# Patient Record
Sex: Male | Born: 1990 | Race: White | Hispanic: No | Marital: Single | Smoking: Never smoker
Health system: Southern US, Community
[De-identification: ages and names within clinical notes are randomized; demographics above are authoritative.]

---
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
## 2013-08-26 ENCOUNTER — Emergency Department (HOSPITAL_COMMUNITY): Payer: No Typology Code available for payment source

## 2013-08-26 ENCOUNTER — Emergency Department (HOSPITAL_COMMUNITY)
Admission: EM | Admit: 2013-08-26 | Discharge: 2013-08-26 | Disposition: A | Discharge status: Home / Self Care | Payer: No Typology Code available for payment source | Attending: Emergency Medicine | Admitting: Emergency Medicine

## 2013-08-26 ENCOUNTER — Encounter (HOSPITAL_COMMUNITY): Payer: Self-pay | Admitting: Emergency Medicine

## 2013-08-26 DIAGNOSIS — S161XXA Strain of muscle, fascia and tendon at neck level, initial encounter: Secondary | ICD-10-CM

## 2013-08-26 DIAGNOSIS — S139XXA Sprain of joints and ligaments of unspecified parts of neck, initial encounter: Secondary | ICD-10-CM | POA: Insufficient documentation

## 2013-08-26 DIAGNOSIS — Y9389 Activity, other specified: Secondary | ICD-10-CM | POA: Insufficient documentation

## 2013-08-26 DIAGNOSIS — S39012A Strain of muscle, fascia and tendon of lower back, initial encounter: Secondary | ICD-10-CM

## 2013-08-26 DIAGNOSIS — S335XXA Sprain of ligaments of lumbar spine, initial encounter: Secondary | ICD-10-CM | POA: Insufficient documentation

## 2013-08-26 DIAGNOSIS — Y92410 Unspecified street and highway as the place of occurrence of the external cause: Secondary | ICD-10-CM | POA: Insufficient documentation

## 2013-08-26 MED ORDER — CYCLOBENZAPRINE HCL 5 MG PO TABS: 5.0000 mg | ORAL_TABLET | Freq: Three times a day (TID) | ORAL | Status: DC | PRN

## 2013-08-26 MED ORDER — IBUPROFEN 600 MG PO TABS: 600.0000 mg | ORAL_TABLET | Freq: Four times a day (QID) | ORAL | Status: DC | PRN
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
## 2013-08-27 NOTE — ED Provider Notes (Signed)
Medical screening examination/treatment/procedure(s) were performed by non-physician practitioner and as supervising physician I was immediately available for consultation/collaboration.    Tenee Wish R Zhaire Locker, MD 08/27/13 2259 
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
## 2015-01-04 ENCOUNTER — Ambulatory Visit (INDEPENDENT_AMBULATORY_CARE_PROVIDER_SITE_OTHER): Payer: BLUE CROSS/BLUE SHIELD | Admitting: Nurse Practitioner

## 2015-01-04 ENCOUNTER — Encounter: Payer: Self-pay | Admitting: Nurse Practitioner

## 2015-01-04 ENCOUNTER — Encounter (INDEPENDENT_AMBULATORY_CARE_PROVIDER_SITE_OTHER): Payer: Self-pay

## 2015-01-04 VITALS — BP 132/84 | HR 81 | Temp 97.0°F | Ht 70.0 in | Wt 181.0 lb

## 2015-01-04 DIAGNOSIS — J340 Abscess, furuncle and carbuncle of nose: Secondary | ICD-10-CM

## 2015-01-04 MED ORDER — SULFAMETHOXAZOLE-TRIMETHOPRIM 800-160 MG PO TABS: 1.0000 | ORAL_TABLET | Freq: Two times a day (BID) | ORAL | Status: DC

## 2015-01-04 NOTE — Progress Notes (Signed)
   Subjective:    Patient ID: Edwin Baxter, male    DOB: 03/28/1991, 24 y.o.   MRN: [id number]  HPI Patient in c/o sore on th tip of his nose- started yesterday as a burning sensation and is sore to the touch. No drainage.    Review of Systems  Constitutional: Negative.   HENT: Negative.   Respiratory: Negative.   Cardiovascular: Negative.   Gastrointestinal: Negative.   Genitourinary: Negative.   Psychiatric/Behavioral: Negative.   All other systems reviewed and are negative.      Objective:   Physical Exam  Constitutional: He is oriented to person, place, and time. He appears well-developed and well-nourished.  HENT:  Right Ear: Hearing, tympanic membrane, external ear and ear canal normal.  Left Ear: Hearing, tympanic membrane, external ear and ear canal normal.  Nose: No mucosal edema or rhinorrhea.  Mouth/Throat: Uvula is midline, oropharynx is clear and moist and mucous membranes are normal.  Tip of nose erythematous and tender to touch  Cardiovascular: Normal rate, regular rhythm and normal heart sounds.   Pulmonary/Chest: Effort normal and breath sounds normal.  Neurological: He is alert and oriented to person, place, and time.  Skin: Skin is warm.  Psychiatric: He has a normal mood and affect. His behavior is normal. Judgment and thought content normal.    BP 132/84 mmHg  Pulse 81  Temp(Src) 97 F (36.1 C) (Oral)  Ht 5\' 10"  (1.778 m)  Wt 181 lb (82.101 kg)  BMI 25.97 kg/m2        Assessment & Plan:  1. Cellulitis of nose, external Do not pick or scratch Warm compresses RTO prn  Meds ordered this encounter  Medications  . sulfamethoxazole-trimethoprim (BACTRIM DS) 800-160 MG per tablet    Sig: Take 1 tablet by mouth 2 (two) times daily.    Dispense:  14 tablet    Refill:  0    Order Specific Question:  Supervising Provider    Answer:  Ernestina PennaMOORE, DONALD W [[id number]]   Mary-Margaret Daphine DeutscherMartin, FNP

## 2015-01-04 NOTE — Patient Instructions (Signed)

## 2015-01-12 ENCOUNTER — Encounter: Payer: Self-pay | Admitting: Nurse Practitioner

## 2015-01-12 ENCOUNTER — Ambulatory Visit (INDEPENDENT_AMBULATORY_CARE_PROVIDER_SITE_OTHER): Payer: BLUE CROSS/BLUE SHIELD | Admitting: Nurse Practitioner

## 2015-01-12 VITALS — BP 131/84 | HR 80 | Temp 96.7°F | Ht 70.0 in | Wt 180.4 lb

## 2015-01-12 DIAGNOSIS — J340 Abscess, furuncle and carbuncle of nose: Secondary | ICD-10-CM

## 2015-01-12 MED ORDER — DOXYCYCLINE HYCLATE 100 MG PO TABS: 100.0000 mg | ORAL_TABLET | Freq: Two times a day (BID) | ORAL | Status: DC

## 2015-01-12 NOTE — Progress Notes (Signed)
   Subjective:    Patient ID: Edwin Baxter, male    DOB: 08/30/1991, 24 y.o.   MRN: [id number]  HPI Patient was seen April 14,2016 with an infection on tip of nose. Was given bactrim and was getting better- Started swelling and turning red again this morning. No drainage.    Review of Systems  Constitutional: Negative.   HENT: Negative.   Respiratory: Negative.   Cardiovascular: Negative.   Genitourinary: Negative.   Neurological: Negative.   Psychiatric/Behavioral: Negative.   All other systems reviewed and are negative.      Objective:   Physical Exam  Constitutional: He appears well-developed and well-nourished.  Cardiovascular: Normal rate, regular rhythm and normal heart sounds.   Pulmonary/Chest: Effort normal and breath sounds normal.  Skin: Skin is warm.  Tip of nose red and indurated- small pustule visible    BP 131/84 mmHg  Pulse 80  Temp(Src) 96.7 F (35.9 C) (Oral)  Ht 5\' 10"  (1.778 m)  Wt 180 lb 6.4 oz (81.829 kg)  BMI 25.88 kg/m2       Assessment & Plan:   1. Cellulitis of nose, external    Meds ordered this encounter  Medications  . doxycycline (VIBRA-TABS) 100 MG tablet    Sig: Take 1 tablet (100 mg total) by mouth 2 (two) times daily. 1 po bid    Dispense:  20 tablet    Refill:  0    Order Specific Question:  Supervising Provider    Answer:  Ernestina PennaMOORE, DONALD W [[id number]]   Warm compresses Do not pick or scratch RTO prn  Mary-Margaret Daphine DeutscherMartin, FNP

## 2015-05-04 ENCOUNTER — Telehealth: Payer: Self-pay | Admitting: Nurse Practitioner

## 2015-05-07 NOTE — Telephone Encounter (Signed)
Called [facility] [employer] to let them know we received release/lc

## 2015-05-08 ENCOUNTER — Encounter (INDEPENDENT_AMBULATORY_CARE_PROVIDER_SITE_OTHER): Payer: Self-pay

## 2015-05-08 ENCOUNTER — Ambulatory Visit (INDEPENDENT_AMBULATORY_CARE_PROVIDER_SITE_OTHER): Payer: BLUE CROSS/BLUE SHIELD

## 2015-05-08 ENCOUNTER — Encounter: Payer: Self-pay | Admitting: Family

## 2015-05-08 ENCOUNTER — Ambulatory Visit (INDEPENDENT_AMBULATORY_CARE_PROVIDER_SITE_OTHER): Payer: BLUE CROSS/BLUE SHIELD | Admitting: Family

## 2015-05-08 VITALS — BP 137/92 | HR 73 | Temp 97.7°F

## 2015-05-08 DIAGNOSIS — S93402A Sprain of unspecified ligament of left ankle, initial encounter: Secondary | ICD-10-CM

## 2015-05-08 DIAGNOSIS — M25572 Pain in left ankle and joints of left foot: Secondary | ICD-10-CM

## 2015-05-08 MED ORDER — IBUPROFEN 600 MG PO TABS: 600.0000 mg | ORAL_TABLET | Freq: Three times a day (TID) | ORAL | Status: DC | PRN

## 2015-05-08 NOTE — Patient Instructions (Signed)

## 2015-05-08 NOTE — Progress Notes (Signed)
   Subjective:    Patient ID: Edwin Baxter, male    DOB: 1991-03-04, 24 y.o.   MRN: [id number]  Ankle Pain  The incident occurred 3 to 5 days ago (Sunday). The incident occurred in the yard. The injury mechanism was a twisting injury. The pain is present in the left ankle. The quality of the pain is described as aching. The pain is at a severity of 5/10. The pain is moderate. The pain has been intermittent since onset. Pertinent negatives include no inability to bear weight, numbness or tingling. He reports no foreign bodies present. The symptoms are aggravated by movement and weight bearing. He has tried ice for the symptoms. The treatment provided mild relief.      Review of Systems  Constitutional: Negative.   HENT: Negative.   Respiratory: Negative.   Cardiovascular: Negative.   Gastrointestinal: Negative.   Endocrine: Negative.   Genitourinary: Negative.   Musculoskeletal: Negative.   Neurological: Negative.  Negative for tingling and numbness.  Hematological: Negative.   Psychiatric/Behavioral: Negative.   All other systems reviewed and are negative.      Objective:   Physical Exam  Constitutional: He is oriented to person, place, and time. He appears well-developed and well-nourished. No distress.  HENT:  Head: Normocephalic.  Eyes: Pupils are equal, round, and reactive to light. Right eye exhibits no discharge. Left eye exhibits no discharge.  Neck: Normal range of motion. Neck supple. No thyromegaly present.  Cardiovascular: Normal rate, regular rhythm, normal heart sounds and intact distal pulses.   No murmur heard. Pulmonary/Chest: Effort normal and breath sounds normal. No respiratory distress. He has no wheezes.  Abdominal: Soft. Bowel sounds are normal. He exhibits no distension. There is no tenderness.  Musculoskeletal: Normal range of motion. He exhibits edema and tenderness.  Left lateral ankle with mild swelling and ecchymosis present   Neurological: He is  alert and oriented to person, place, and time. He has normal reflexes. No cranial nerve deficit.  Skin: Skin is warm and dry. No rash noted. No erythema.  Psychiatric: He has a normal mood and affect. His behavior is normal. Judgment and thought content normal.  Vitals reviewed.  Left ankle x-ray- WNL Preliminary reading by Jannifer Rodney, FNP [facility]     BP 137/92 mmHg  Pulse 73  Temp(Src) 97.7 F (36.5 C) (Oral)     Assessment & Plan:  1. Pain in joint, ankle and foot, left - DG Ankle Complete Left; Future  2. Left ankle sprain, initial encounter -Rest -Keep elevated -Ice -Compression- ACE wrapped shown to pt and left ankle wrapped in office today -RTO as needed - ibuprofen (ADVIL,MOTRIN) 600 MG tablet; Take 1 tablet (600 mg total) by mouth every 8 (eight) hours as needed.  Dispense: 45 tablet; Refill: 0    Jannifer Rodney, FNP

## 2016-01-17 IMAGING — CR DG ANKLE COMPLETE 3+V*L*
3 series · 3 of 3 positions shown · non-contrast
Comparison: None.

CLINICAL DATA: Injury of the left ankle while playing basketball 2
days ago, persistent pain and swelling

EXAM:
LEFT ANKLE COMPLETE - 3+ VIEW

[view not recorded (1 of 3)]
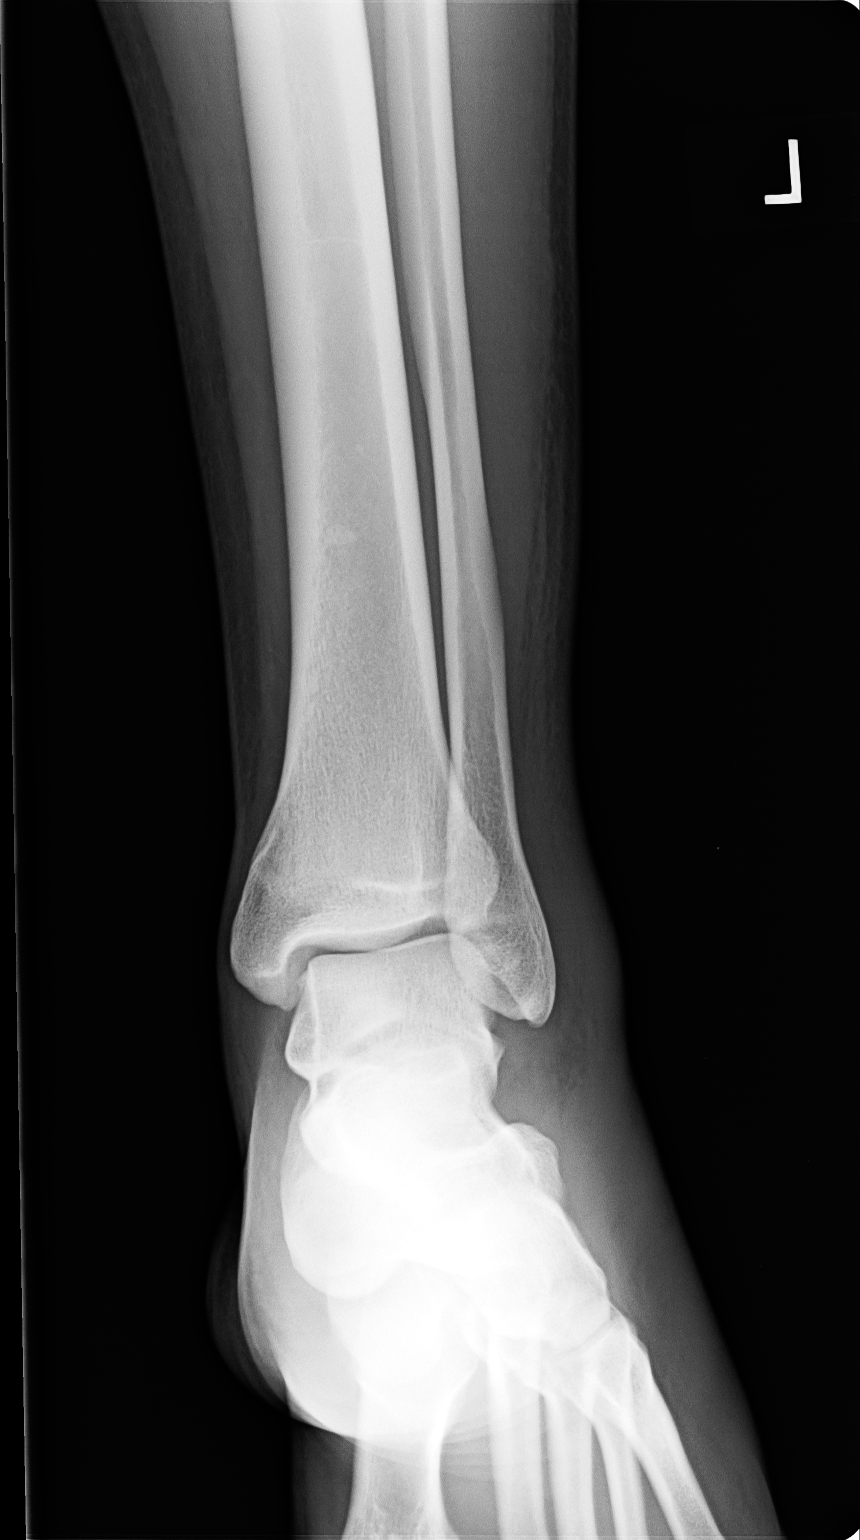

[view not recorded (2 of 3)]
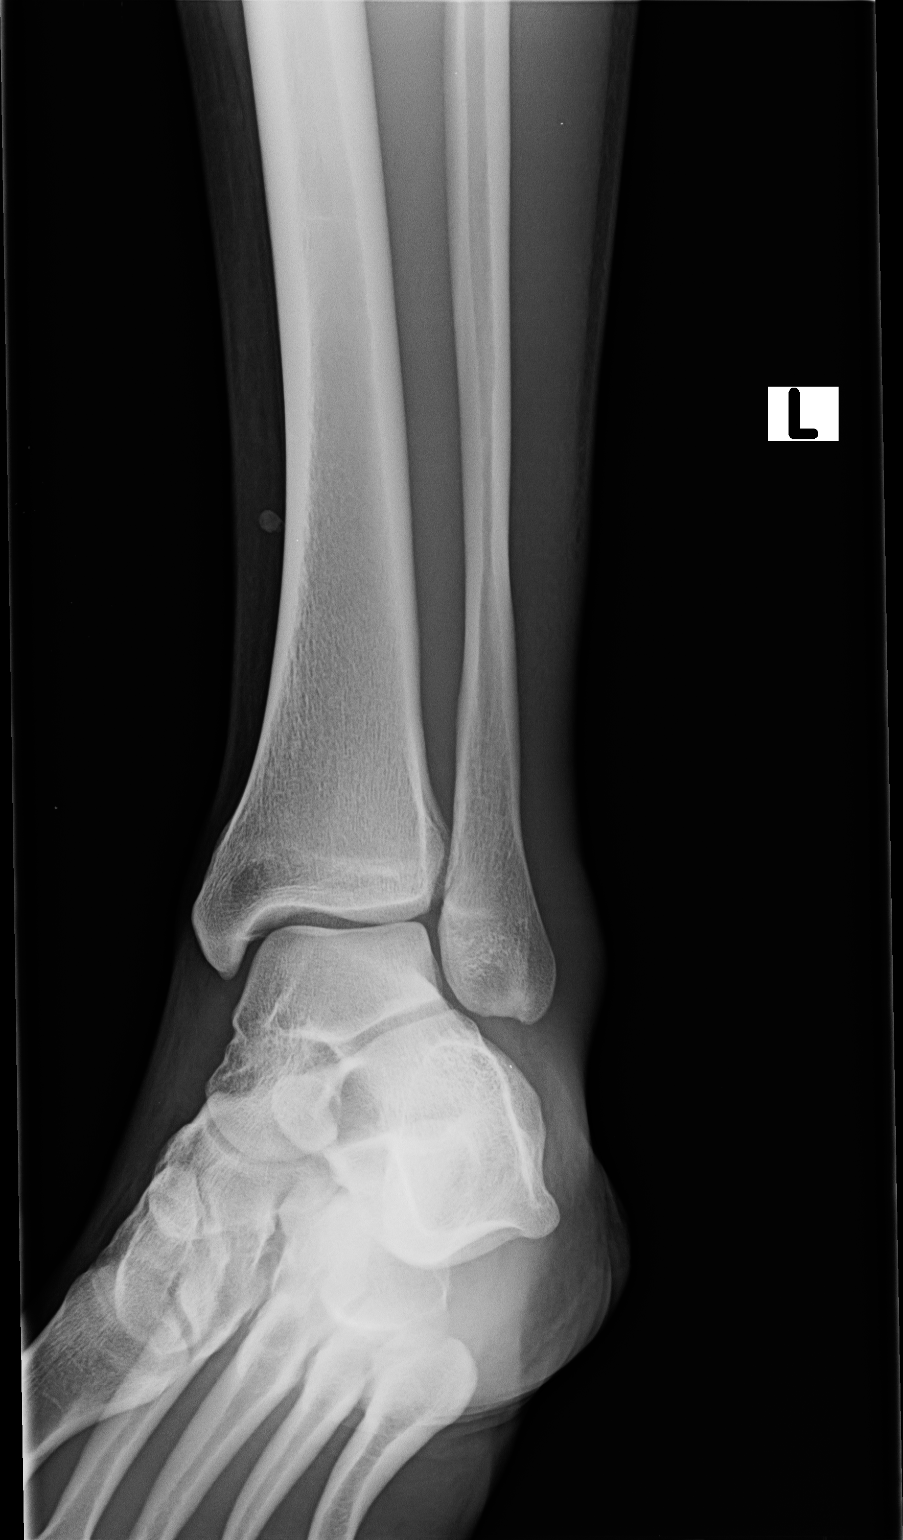

[view not recorded (3 of 3)]
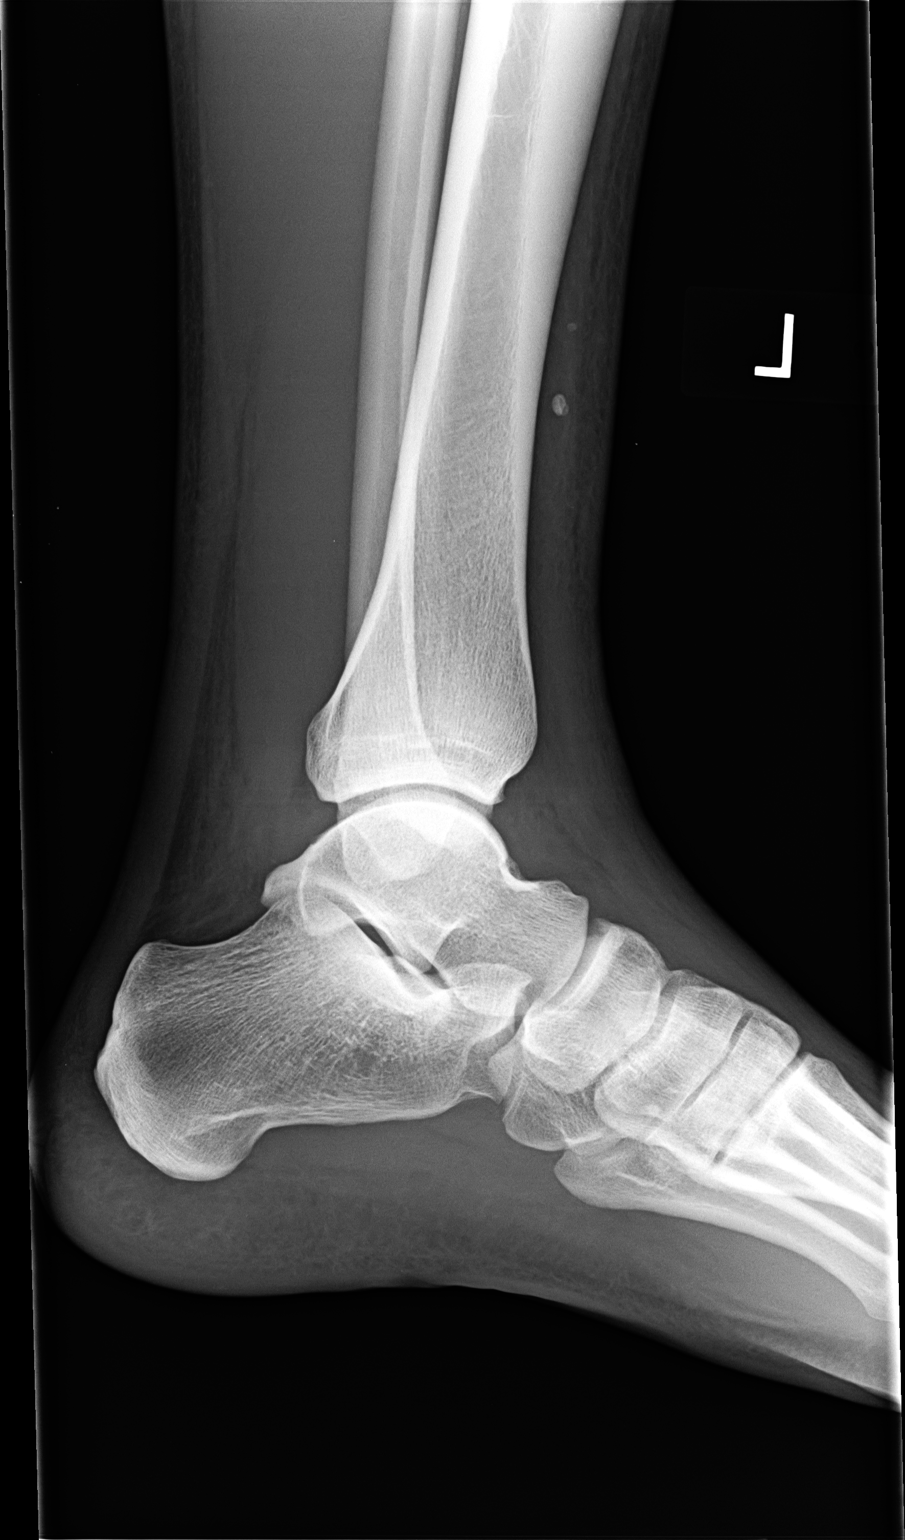

[3 of 3 positions shown; findings below may reference images not displayed]

FINDINGS: The ankle joint mortise is preserved. The talar dome is intact.
There is no acute malleolar fracture. The talus and calcaneus are
unremarkable. The other tarsal bones as well as the visualized
portions of the metatarsal bases are normal. There is soft tissue
swelling anteriorly and laterally.
IMPRESSION: There is soft tissue swelling over the lateral malleolus. No acute
bony abnormality is observed.

## 2016-07-11 ENCOUNTER — Encounter: Payer: Self-pay | Admitting: Pediatrics

## 2016-07-11 ENCOUNTER — Ambulatory Visit (INDEPENDENT_AMBULATORY_CARE_PROVIDER_SITE_OTHER): Payer: BLUE CROSS/BLUE SHIELD | Admitting: Pediatrics

## 2016-07-11 VITALS — BP 137/86 | HR 93 | Temp 98.8°F | Ht 70.0 in | Wt 191.4 lb

## 2016-07-11 DIAGNOSIS — M2142 Flat foot [pes planus] (acquired), left foot: Secondary | ICD-10-CM

## 2016-07-11 DIAGNOSIS — M76822 Posterior tibial tendinitis, left leg: Secondary | ICD-10-CM

## 2016-07-11 NOTE — Progress Notes (Signed)
  Subjective:   Patient ID: Edwin Baxter, male    DOB: 10/23/1990, 25 y.o.   MRN: [id number] CC: Ankle Pain (Left)  HPI: Edwin Baxter is a 25 y.o. male presenting for Ankle Pain (Left)  Injured L ankle 14 mo ago sprinaed it playing basketball Healed Then sprained again 4 months ago playing basketball Both times rolled onto the outside of L ankle Now continues to hurt when he walks on it at times  Working at [employer], has to walk a lot there Doesn't always hurt when he walks on it, bothers him more when he walks for long periods of time  Not taking any medications now, not icing, not using ankle support  Relevant past medical, surgical, family and social history reviewed. Allergies and medications reviewed and updated. History  Smoking Status  . Never Smoker  Smokeless Tobacco  . Never Used   ROS: Per HPI   Objective:    BP 137/86   Pulse 93   Temp 98.8 F (37.1 C) (Oral)   Ht 5\' 10"  (1.778 m)   Wt 191 lb 6.4 oz (86.8 kg)   BMI 27.46 kg/m   Wt Readings from Last 3 Encounters:  07/11/16 191 lb 6.4 oz (86.8 kg)  01/12/15 180 lb 6.4 oz (81.8 kg)  01/04/15 181 lb (82.1 kg)    Gen: NAD, alert, cooperative with exam, NCAT EYES: EOMI, no conjunctival injection, or no icterus Ext: No edema, warm Neuro: Alert and oriented WGN:[id number]:Ankle: No visible erythema or swelling either foot Range of motion is full in all directions b/l Pain with eversion at L ankle against resistance, Strength is 5/5 in all other directions.Talar dome nontender; No pain at base of 5th MT; No tenderness over cuboid; No tenderness over N spot or navicular prominence Slightly tender vs sensitive per pt with palpation below L lateral malleolus Able to walk 4 steps.   Assessment & Plan:  Edwin Baxter was seen today for ankle pain.  Diagnoses and all orders for this visit:  Posterior tibial tendon dysfunction (PTTD) of left lower extremity Discussed NSAIDs, ice, gave ankle strengthening exercises, use  support when needed  Elevated blood pressure reading Improved with recheck  Follow up plan: As needed Rex Krasarol Vincent, MD [facility]

## 2016-07-11 NOTE — Patient Instructions (Signed)
Use ankle support as needed Ibuprofen 400-600mg  2-3 times a day as needed Ice when aching Ankle strengthening exercises twice a day

## 2016-07-23 ENCOUNTER — Ambulatory Visit (INDEPENDENT_AMBULATORY_CARE_PROVIDER_SITE_OTHER): Payer: Self-pay | Admitting: Family Medicine

## 2016-07-23 ENCOUNTER — Encounter: Payer: Self-pay | Admitting: Family Medicine

## 2016-07-23 VITALS — BP 132/80 | HR 100 | Temp 99.1°F | Ht 70.0 in | Wt 190.0 lb

## 2016-07-23 DIAGNOSIS — N410 Acute prostatitis: Secondary | ICD-10-CM

## 2016-07-23 MED ORDER — DOXYCYCLINE HYCLATE 100 MG PO CAPS: 100.0000 mg | ORAL_CAPSULE | Freq: Two times a day (BID) | ORAL | 0 refills | Status: DC

## 2016-07-23 NOTE — Progress Notes (Signed)
Subjective:  Patient ID: Edwin Baxter, male    DOB: 05/13/1991  Age: 25 y.o. MRN: [id number]  CC: Testicle Pain (pt here today c/o testicular pain, he describes it as constant achey and just not feeling well. He denies any injury to the area.)   HPI Cleburn Everman presents for Onset yesterday during the day slowly. He felt a sharp pain running from the lateral hip to the groin region bilaterally. He denies any injury. Later in the day he noted that his testicles were quite sore and achy. Pain was moderate. It was not accompanied by nausea. He has not been sexually active for over 2 years. He says he's become a [place] and is avoiding sex prior to marriage. Last masturbation was about a week ago. No pain associated. Bowel movements have been normal. Urination normal.  Patient is attempting to practice sexual purity based on his religious beliefs. Of note is that he does have misgivings about masturbation but has been unable to avoid it. He has had nocturnal emissions periodically as well. His erections occur about half the days when he awakens. However, he works nights.   History Milton has no past medical history on file.   He has no past surgical history on file.   His family history is not on file.He reports that he has never smoked. He has never used smokeless tobacco. He reports that he does not drink alcohol or use drugs.    ROS Review of Systems  Constitutional: Negative for chills, diaphoresis and fever.  HENT: Negative for rhinorrhea and sore throat.   Respiratory: Negative for cough and shortness of breath.   Cardiovascular: Negative for chest pain.  Gastrointestinal: Negative for abdominal pain.  Genitourinary: Positive for testicular pain. Negative for decreased urine volume, difficulty urinating, discharge, dysuria, enuresis, frequency, hematuria, penile pain, penile swelling, scrotal swelling and urgency.  Musculoskeletal: Negative for arthralgias and myalgias.    Skin: Negative for rash.  Neurological: Negative for weakness and headaches.    Objective:  BP 132/80   Pulse 100   Temp 99.1 F (37.3 C) (Oral)   Ht 5\' 10"  (1.778 m)   Wt 190 lb (86.2 kg)   BMI 27.26 kg/m   BP Readings from Last 3 Encounters:  07/23/16 132/80  07/11/16 137/86  05/08/15 (!) 137/92    Wt Readings from Last 3 Encounters:  07/23/16 190 lb (86.2 kg)  07/11/16 191 lb 6.4 oz (86.8 kg)  01/12/15 180 lb 6.4 oz (81.8 kg)     Physical Exam  Constitutional: He appears well-developed and well-nourished.  HENT:  Head: Normocephalic and atraumatic.  Right Ear: Tympanic membrane and external ear normal. No decreased hearing is noted.  Left Ear: Tympanic membrane and external ear normal. No decreased hearing is noted.  Mouth/Throat: No oropharyngeal exudate or posterior oropharyngeal erythema.  Eyes: Pupils are equal, round, and reactive to light.  Neck: Normal range of motion. Neck supple.  Cardiovascular: Normal rate and regular rhythm.   No murmur heard. Pulmonary/Chest: Breath sounds normal. No respiratory distress.  Abdominal: Soft. Bowel sounds are normal. He exhibits no mass. There is no tenderness. Hernia confirmed negative in the right inguinal area and confirmed negative in the left inguinal area.  Genitourinary: Testes normal. Cremasteric reflex is present. Right testis shows no swelling and no tenderness. Left testis shows no swelling and no tenderness. No phimosis, hypospadias, penile erythema or penile tenderness. No discharge found.  Lymphadenopathy:       Right: No inguinal adenopathy present.  Left: No inguinal adenopathy present.  Vitals reviewed.    No results found for: WBC, HGB, HCT, PLT, GLUCOSE, CHOL, TRIG, HDL, LDLDIRECT, LDLCALC, ALT, AST, NA, K, CL, CREATININE, BUN, CO2, TSH, PSA, INR, GLUF, HGBA1C, MICROALBUR   Assessment & Plan:   Asad was seen today for testicle pain.  Diagnoses and all orders for this  visit:  Prostatitis, acute  Other orders -     doxycycline (VIBRAMYCIN) 100 MG capsule; Take 1 capsule (100 mg total) by mouth 2 (two) times daily.    Approximately 40 minutes was spent with this patient. Over half of this was spent in counseling regarding sexuality and morality regarding sexual purity. Also discussed was his use of pornography  and the impact it could have on future relationships. We discussed masturbation as a physical process as opposed to a function of intimacy.  I am having Mr. Birdie RiddleKendrick start on doxycycline. I am also having him maintain his ibuprofen.  Meds ordered this encounter  Medications  . doxycycline (VIBRAMYCIN) 100 MG capsule    Sig: Take 1 capsule (100 mg total) by mouth 2 (two) times daily.    Dispense:  28 capsule    Refill:  0     Follow-up: Return if symptoms worsen or fail to improve.  Mechele ClaudeWarren Brance Dartt, M.D.

## 2016-08-07 ENCOUNTER — Ambulatory Visit (INDEPENDENT_AMBULATORY_CARE_PROVIDER_SITE_OTHER): Payer: Self-pay | Admitting: Family Medicine

## 2016-08-07 ENCOUNTER — Encounter: Payer: Self-pay | Admitting: Family Medicine

## 2016-08-07 VITALS — BP 125/76 | HR 111 | Temp 97.9°F | Ht 70.0 in | Wt 187.2 lb

## 2016-08-07 DIAGNOSIS — N410 Acute prostatitis: Secondary | ICD-10-CM

## 2016-08-07 MED ORDER — SULFAMETHOXAZOLE-TRIMETHOPRIM 800-160 MG PO TABS: 1.0000 | ORAL_TABLET | Freq: Two times a day (BID) | ORAL | 0 refills | Status: DC

## 2016-08-07 NOTE — Patient Instructions (Signed)
Great to meet you!  Come back if you have any concerns or questions.    Prostatitis Prostatitis is swelling of the prostate gland. The prostate helps to make semen. It is below a man's bladder, in front of the rectum. There are different types of prostatitis. Follow these instructions at home:  Take over-the-counter and prescription medicines only as told by your doctor.  If you were prescribed an antibiotic medicine, take it as told by your doctor. Do not stop taking the antibiotic even if you start to feel better.  If your doctor prescribed exercises, do them as directed.  Take sitz baths as told by your doctor. To take a sitz bath, sit in warm water that is deep enough to cover your hips and butt.  Keep all follow-up visits as told by your doctor. This is important. Contact a doctor if:  Your symptoms get worse.  You have a fever. Get help right away if:  You have chills.  You feel sick to your stomach (nauseous).  You throw up (vomit).  You feel light-headed.  You feel like you might pass out (faint).  You cannot pee (urinate).  You have blood or clumps of blood (blood clots) in your pee (urine). This information is not intended to replace advice given to you by your health care provider. Make sure you discuss any questions you have with your health care provider. Document Released: 03/09/2012 Document Revised: 05/29/2016 Document Reviewed: 05/29/2016 Elsevier Interactive Patient Education  2017 [employer]

## 2016-08-07 NOTE — Progress Notes (Signed)
   HPI  Patient presents today here for recurrent prostatitis symptoms.  Patient was seen in about 2 weeks ago and treated with doxycycline for acute prostatitis. He had good improvement of symptoms for about 2 weeks. 3 or 4 days after the medications were finished he had recurrent runs of symptoms.  He also endorses chills, lightheadedness, and return of pain.  The pain is described as dull achy nonspecific pelvic pain radiating from testicles to the low back. He does not have bladder pain with ejaculation. He has not been sexually active for 2 years as detailed in his PCPs previous note.  PMH: Smoking status noted ROS: Per HPI  Objective: BP 125/76   Pulse (!) 111   Temp 97.9 F (36.6 C) (Oral)   Ht 5\' 10"  (1.778 m)   Wt 187 lb 3.2 oz (84.9 kg)   BMI 26.86 kg/m  Gen: NAD, alert, cooperative with exam HEENT: NCAT CV: RRR, good S1/S2, no murmur Resp: CTABL, no wheezes, non-labored Ext: No edema, warm Neuro: Alert and oriented, No gross deficits GU: Normal male genitalia, no tenderness to palpation of bilateral testicles, no penile discharge DRE deferred per patient preference  Assessment and plan:  # Prostatitis Very typical pain of prostatitis Recurrence of symptoms after finishing 2 weeks of doxycycline Will give extended course of Bactrim Unlikely to be related to any STI's given celibacy for 2 years Return to clinic with any concerns    Meds ordered this encounter  Medications  . sulfamethoxazole-trimethoprim (BACTRIM DS) 800-160 MG tablet    Sig: Take 1 tablet by mouth 2 (two) times daily.    Dispense:  42 tablet    Refill:  0    Murtis SinkSam Malyia Moro, MD [facility] 08/07/2016, 11:47 AM

## 2016-09-01 ENCOUNTER — Encounter: Payer: Self-pay | Admitting: Family Medicine

## 2016-09-01 ENCOUNTER — Ambulatory Visit (INDEPENDENT_AMBULATORY_CARE_PROVIDER_SITE_OTHER): Payer: Self-pay | Admitting: Family Medicine

## 2016-09-01 VITALS — BP 148/74 | HR 105 | Temp 97.9°F | Ht 70.0 in | Wt 188.8 lb

## 2016-09-01 DIAGNOSIS — N410 Acute prostatitis: Secondary | ICD-10-CM

## 2016-09-01 MED ORDER — CIPROFLOXACIN HCL 250 MG PO TABS: 250.0000 mg | ORAL_TABLET | Freq: Two times a day (BID) | ORAL | 0 refills | Status: DC

## 2016-09-01 NOTE — Progress Notes (Signed)
   HPI  Patient presents today here with concern for a current prostatitis.  Patient explains that after his previous robotic course he had improvement for about 10 days and then had recurrence of symptoms.  He states over the last 10 days or so he's had subjective fever, malaise, and nonspecific pelvic pain. He has pelvic pain with sitting at times. The pain is dull and achy and comes and goes.  He's tolerating foods and fluids normally. He has not had any measured fevers. He has mild malaise.  He does not have any sexual contacts and states that he has not been sexually active in over a year.  PMH: Smoking status noted ROS: Per HPI  Objective: BP (!) 148/74   Pulse (!) 105   Temp 97.9 F (36.6 C) (Oral)   Ht 5\' 10"  (1.778 m)   Wt 188 lb 12.8 oz (85.6 kg)   BMI 27.09 kg/m  Gen: NAD, alert, cooperative with exam HEENT: NCAT CV: RRR, good S1/S2, no murmur Resp: CTABL, no wheezes, non-labored Abd: Bilateral lower quadrant abdominal tenderness to palpation, no guarding, positive bowel sounds, soft Neuro: Alert and oriented, No gross deficits  DRE:  Normal rectal tone, very tender to palp of the prostate. Which is slightly large for age.   Assessment and plan:  # Acute bacterial prostatitis Some improvement previously with doxycycline and then with Bactrim, however has recurrence of symptoms Diagnosis is more established today with very positive prostate tenderness on palpation Treat with Cipro 6 weeks Discussed the issue and possibility of tendinopathy with Cipro and young, he will observe carefully for any issues and call right away if these occur.  Pulse elevated as well as blood pressure elevated today, patient is very nervous. I do not believe this is a reflection of his infection. He appears very well.    Meds ordered this encounter  Medications  . ciprofloxacin (CIPRO) 250 MG tablet    Sig: Take 1 tablet (250 mg total) by mouth 2 (two) times daily.   Dispense:  84 tablet    Refill:  0    Murtis SinkSam Gaelan Glennon, MD [facility] 09/01/2016, 9:19 AM

## 2016-09-01 NOTE — Patient Instructions (Signed)
Edwin HalonGrea tto see you!  Remember the major caution is tendon pain or injury while on thi smedication. Call right away with any concerns  Finis all antibiotics  Take a probiotic twice daily with the medication.    Prostatitis Prostatitis is swelling of the prostate gland. The prostate helps to make semen. It is below a man's bladder, in front of the rectum. There are different types of prostatitis. Follow these instructions at home:  Take over-the-counter and prescription medicines only as told by your doctor.  If you were prescribed an antibiotic medicine, take it as told by your doctor. Do not stop taking the antibiotic even if you start to feel better.  If your doctor prescribed exercises, do them as directed.  Take sitz baths as told by your doctor. To take a sitz bath, sit in warm water that is deep enough to cover your hips and butt.  Keep all follow-up visits as told by your doctor. This is important. Contact a doctor if:  Your symptoms get worse.  You have a fever. Get help right away if:  You have chills.  You feel sick to your stomach (nauseous).  You throw up (vomit).  You feel light-headed.  You feel like you might pass out (faint).  You cannot pee (urinate).  You have blood or clumps of blood (blood clots) in your pee (urine). This information is not intended to replace advice given to you by your health care provider. Make sure you discuss any questions you have with your health care provider. Document Released: 03/09/2012 Document Revised: 05/29/2016 Document Reviewed: 05/29/2016 Elsevier Interactive Patient Education  2017 [employer]

## 2016-10-29 ENCOUNTER — Ambulatory Visit (INDEPENDENT_AMBULATORY_CARE_PROVIDER_SITE_OTHER): Payer: BLUE CROSS/BLUE SHIELD | Admitting: Family Medicine

## 2016-10-29 ENCOUNTER — Encounter: Payer: Self-pay | Admitting: Family Medicine

## 2016-10-29 VITALS — BP 137/84 | HR 123 | Temp 99.2°F | Ht 70.0 in | Wt 189.0 lb

## 2016-10-29 DIAGNOSIS — N410 Acute prostatitis: Secondary | ICD-10-CM

## 2016-10-29 MED ORDER — SULFAMETHOXAZOLE-TRIMETHOPRIM 800-160 MG PO TABS: 1.0000 | ORAL_TABLET | Freq: Two times a day (BID) | ORAL | 1 refills | Status: DC

## 2016-10-29 NOTE — Progress Notes (Signed)
   Subjective:  Patient ID: Edwin Baxter, male    DOB: 11/23/1990  Age: 26 y.o. MRN: [id number]  CC: Prostatitis (pt here today c/o pain when sitting down and aching in his testicles)   HPI Edwin Baxter presents for Vague pain returning to perineal area. Was much better with cipro, but he developed aching in the tendons. He DCed the med early. That was a little over a month ago. Feeling much better now than he did at original presentation, however he does feel some symptoms returning. Pt. Remains abstinent sexually.    History Edwin Baxter has no past medical history on file.   He has no past surgical history on file.   His family history is not on file.He reports that he has never smoked. He has never used smokeless tobacco. He reports that he does not drink alcohol or use drugs.  No current outpatient prescriptions on file prior to visit.   No current facility-administered medications on file prior to visit.     1  ROS Review of Systems  Constitutional: Negative for chills, diaphoresis and fever.  HENT: Negative for rhinorrhea and sore throat.   Eyes: Positive for itching.  Respiratory: Negative for cough and shortness of breath.   Cardiovascular: Negative for chest pain.  Gastrointestinal: Negative for abdominal pain.  Genitourinary: Positive for penile pain and testicular pain. Negative for difficulty urinating, dysuria, frequency and genital sores.  Musculoskeletal: Negative for arthralgias and myalgias.  Skin: Negative for rash.  Neurological: Negative for weakness and headaches.    Objective:  BP 137/84   Pulse (!) 123   Temp 99.2 F (37.3 C) (Oral)   Ht 5\' 10"  (1.778 m)   Wt 189 lb (85.7 kg)   BMI 27.12 kg/m   Physical Exam  Constitutional: He appears well-developed and well-nourished.  HENT:  Head: Normocephalic and atraumatic.  Right Ear: Tympanic membrane and external ear normal. No decreased hearing is noted.  Left Ear: Tympanic membrane and external ear  normal. No decreased hearing is noted.  Mouth/Throat: No oropharyngeal exudate or posterior oropharyngeal erythema.  Eyes: Pupils are equal, round, and reactive to light.  Neck: Normal range of motion. Neck supple.  Cardiovascular: Normal rate and regular rhythm.   No murmur heard. Pulmonary/Chest: Breath sounds normal. No respiratory distress.  Abdominal: Soft. Bowel sounds are normal. He exhibits no mass. There is no tenderness.  Genitourinary: Testes normal and penis normal. Prostate is enlarged and tender (boggy).  Vitals reviewed.   Assessment & Plan:   Edwin Baxter was seen today for prostatitis.  Diagnoses and all orders for this visit:  Acute prostatitis  Other orders -     sulfamethoxazole-trimethoprim (BACTRIM DS,SEPTRA DS) 800-160 MG tablet; Take 1 tablet by mouth 2 (two) times daily. For 8 weeks   I have discontinued Edwin Baxter's ibuprofen and ciprofloxacin. I am also having him start on sulfamethoxazole-trimethoprim.  Meds ordered this encounter  Medications  . sulfamethoxazole-trimethoprim (BACTRIM DS,SEPTRA DS) 800-160 MG tablet    Sig: Take 1 tablet by mouth 2 (two) times daily. For 8 weeks    Dispense:  60 tablet    Refill:  1   Cipro worked best for him, however, given the need for an extended course of abx, the risk of tendon rupure is to great. Therefore, sulfa substitued.  Follow-up: Return in about 6 weeks (around 12/10/2016).  Mechele ClaudeWarren Ramondo Dietze, M.D.

## 2017-02-03 ENCOUNTER — Ambulatory Visit (INDEPENDENT_AMBULATORY_CARE_PROVIDER_SITE_OTHER): Payer: BLUE CROSS/BLUE SHIELD | Admitting: Family Medicine

## 2017-02-03 ENCOUNTER — Encounter: Payer: Self-pay | Admitting: Family Medicine

## 2017-02-03 VITALS — BP 150/92 | HR 124 | Temp 100.0°F | Ht 70.0 in | Wt 189.0 lb

## 2017-02-03 DIAGNOSIS — N410 Acute prostatitis: Secondary | ICD-10-CM | POA: Diagnosis not present

## 2017-02-03 MED ORDER — CIPROFLOXACIN HCL 250 MG PO TABS: 250.0000 mg | ORAL_TABLET | Freq: Two times a day (BID) | ORAL | 0 refills | Status: DC

## 2017-02-03 NOTE — Progress Notes (Signed)
   Subjective:  Patient ID: Edwin Baxter, male    DOB: 02/02/1991  Age: 26 y.o. MRN: [id number]  CC: Prostatitis (pt here today c/o urge to use the bathroom and discomfort when he sits down)   HPI Tykeem Reddin presents for Recurrence of his prostate symptoms. Primarily some dysuria and some frequency of urination. No incontinence. No fever chills sweats. Patient is not sexually active. He masturbates a couple of times a week  History Joncarlo has no past medical history on file.   He has no past surgical history on file.   His family history is not on file.He reports that he has never smoked. He has never used smokeless tobacco. He reports that he does not drink alcohol or use drugs.  No current outpatient prescriptions on file prior to visit.   No current facility-administered medications on file prior to visit.     ROS Review of Systems Noncontributory with exception of history of present illness Objective:  BP (!) 150/92   Pulse (!) 124   Temp 100 F (37.8 C) (Oral)   Ht 5\' 10"  (1.778 m)   Wt 189 lb (85.7 kg)   BMI 27.12 kg/m   Physical Exam  Genitourinary:  Genitourinary Comments: Prostate has 2 areas of edema noted.   Prostate boggy, swollen at left lobe superiorly and right lobe  laterally Assessment & Plan:   Levie was seen today for prostatitis.  Diagnoses and all orders for this visit:  Prostatitis, acute -     ciprofloxacin (CIPRO) 250 MG tablet; Take 1 tablet (250 mg total) by mouth 2 (two) times daily.   I have discontinued Mr. Pilar JarvisKendrick's sulfamethoxazole-trimethoprim. I am also having him maintain his ciprofloxacin.  Meds ordered this encounter  Medications  . ciprofloxacin (CIPRO) 250 MG tablet    Sig: Take 1 tablet (250 mg total) by mouth 2 (two) times daily.    Dispense:  84 tablet    Refill:  0     Follow-up: Return if symptoms worsen or fail to improve.  Mechele ClaudeWarren Kyshawn Teal, M.D.

## 2017-02-11 ENCOUNTER — Telehealth: Payer: Self-pay | Admitting: Family Medicine

## 2017-02-12 NOTE — Telephone Encounter (Signed)
Attempted to contact patient- No VM has been sent up. This encounter will be closed.

## 2017-02-24 ENCOUNTER — Encounter: Payer: Self-pay | Admitting: Family Medicine

## 2017-02-24 ENCOUNTER — Ambulatory Visit (INDEPENDENT_AMBULATORY_CARE_PROVIDER_SITE_OTHER): Payer: BLUE CROSS/BLUE SHIELD | Admitting: Family Medicine

## 2017-02-24 VITALS — BP 164/98 | HR 115 | Temp 98.1°F | Ht 70.0 in | Wt 191.0 lb

## 2017-02-24 DIAGNOSIS — N410 Acute prostatitis: Secondary | ICD-10-CM

## 2017-02-24 DIAGNOSIS — N411 Chronic prostatitis: Secondary | ICD-10-CM

## 2017-02-24 NOTE — Progress Notes (Signed)
Subjective:  Patient ID: Edwin Baxter, male    DOB: 01/22/1991  Age: 26 y.o. MRN: [id number]  CC: Medication Reaction (pt here today c/o aches while taking Cipro so he stopped taking it)   HPI Edwin Baxter presents for Continued dysuria. Patient denies sexual contact. He says that the Cipro made him more achy in the joints and muscles when he discontinued it after about 4-5 days. Symptoms of achiness when away quickly. This is the second occasion for this to happen. Additionally he tells me that he is having to urinate more frequently and feeling some discomfort fullness and having to urinate urgently at times. This seems to be low-grade and chronic at this point.   History Edwin Baxter has no past medical history on file.   He has no past surgical history on file.   His family history is not on file.He reports that he has never smoked. He has never used smokeless tobacco. He reports that he does not drink alcohol or use drugs.    ROS Review of Systems  Constitutional: Negative for activity change, appetite change, chills and fever.  HENT: Negative.   Respiratory: Negative.   Cardiovascular: Negative.   Gastrointestinal: Positive for rectal pain (is with defecation it feels like his prostate is burning). Negative for abdominal pain and nausea.  Endocrine: Negative.   Genitourinary: Positive for difficulty urinating, dysuria, frequency, penile pain, testicular pain and urgency. Negative for decreased urine volume, discharge, enuresis, flank pain, hematuria, penile swelling and scrotal swelling.  Musculoskeletal: Positive for arthralgias and myalgias.  Neurological: Negative for dizziness.  Psychiatric/Behavioral: Negative.     Objective:  BP (!) 164/98   Pulse (!) 115   Temp 98.1 F (36.7 C) (Oral)   Ht 5\' 10"  (1.778 m)   Wt 191 lb (86.6 kg)   BMI 27.41 kg/m   BP Readings from Last 3 Encounters:  02/24/17 (!) 164/98  02/03/17 (!) 150/92  10/29/16 137/84    Wt Readings  from Last 3 Encounters:  02/24/17 191 lb (86.6 kg)  02/03/17 189 lb (85.7 kg)  10/29/16 189 lb (85.7 kg)     Physical Exam  Constitutional: He is oriented to person, place, and time. He appears well-developed and well-nourished.  HENT:  Head: Normocephalic and atraumatic.  Right Ear: Tympanic membrane and external ear normal. No decreased hearing is noted.  Left Ear: Tympanic membrane and external ear normal. No decreased hearing is noted.  Mouth/Throat: No oropharyngeal exudate or posterior oropharyngeal erythema.  Eyes: Pupils are equal, round, and reactive to light.  Neck: Normal range of motion. Neck supple.  Cardiovascular: Normal rate and regular rhythm.   No murmur heard. Pulmonary/Chest: Breath sounds normal. No respiratory distress.  Abdominal: Soft. Bowel sounds are normal. He exhibits no mass. There is no tenderness.  Neurological: He is alert and oriented to person, place, and time.  Vitals reviewed.     Assessment & Plan:   Edwin Baxter was seen today for medication reaction.  Diagnoses and all orders for this visit:  Prostatitis, acute -     Ambulatory referral to Urology  Prostatitis, chronic -     Ambulatory referral to Urology       I have discontinued Mr. Pilar JarvisKendrick's ciprofloxacin.  Allergies as of 02/24/2017      Reactions   Ciprofloxacin Other (See Comments)   aches      Medication List    as of 02/24/2017 11:59 PM   You have not been prescribed any medications.  Follow-up: Return if symptoms worsen or fail to improve.  Claretta Fraise, M.D.

## 2017-08-28 ENCOUNTER — Encounter: Payer: Self-pay | Admitting: Family

## 2017-08-28 ENCOUNTER — Ambulatory Visit (INDEPENDENT_AMBULATORY_CARE_PROVIDER_SITE_OTHER): Payer: BLUE CROSS/BLUE SHIELD | Admitting: Family

## 2017-08-28 VITALS — BP 151/88 | HR 126 | Temp 97.7°F | Ht 70.0 in | Wt 187.2 lb

## 2017-08-28 DIAGNOSIS — K047 Periapical abscess without sinus: Secondary | ICD-10-CM | POA: Diagnosis not present

## 2017-08-28 DIAGNOSIS — R030 Elevated blood-pressure reading, without diagnosis of hypertension: Secondary | ICD-10-CM | POA: Diagnosis not present

## 2017-08-28 DIAGNOSIS — K0889 Other specified disorders of teeth and supporting structures: Secondary | ICD-10-CM

## 2017-08-28 MED ORDER — AMOXICILLIN-POT CLAVULANATE 875-125 MG PO TABS: 1.0000 | ORAL_TABLET | Freq: Two times a day (BID) | ORAL | 0 refills | Status: DC

## 2017-08-28 NOTE — Progress Notes (Signed)
   Subjective:    Patient ID: Edwin Baxter, male    DOB: 07/09/1991, 26 y.o.   MRN: [id number]  PT presents to the office today with recurrent dental pain that started yesterday. Pt states his dentist told him a couple of weeks ago he needed a root canal on his this tooth, but at that time he was not having any pain. Pt states yesterday he started have constant pain of 8 out 10 in his right lower tooth.  Pt has follow up appt with his dentist on Monday.   PT's BP is elevated today, but states this always happens when he comes to the doctor office. States when he checks at home his BP 120/80's. Dental Pain   This is a recurrent problem. The current episode started yesterday. The problem occurs constantly. The problem has been gradually worsening. The pain is at a severity of 8/10. Associated symptoms include facial pain. Pertinent negatives include no fever, oral bleeding or sinus pressure. He has tried NSAIDs, acetaminophen, ice and rest for the symptoms. The treatment provided mild relief.      Review of Systems  Constitutional: Negative for fever.  HENT: Negative for sinus pressure.   All other systems reviewed and are negative.      Objective:   Physical Exam  Constitutional: He is oriented to person, place, and time. He appears well-developed and well-nourished. No distress.  HENT:  Head: Normocephalic.  Right Ear: External ear normal.  Left Ear: External ear normal.  Mouth/Throat: Oropharynx is clear and moist. Dental abscesses and dental caries present.    Eyes: Pupils are equal, round, and reactive to light. Right eye exhibits no discharge. Left eye exhibits no discharge.  Neck: Normal range of motion. Neck supple. No thyromegaly present.  Cardiovascular: Normal rate, regular rhythm, normal heart sounds and intact distal pulses.  No murmur heard. Pulmonary/Chest: Effort normal and breath sounds normal. No respiratory distress. He has no wheezes.  Abdominal: Soft. Bowel  sounds are normal. He exhibits no distension. There is no tenderness.  Musculoskeletal: Normal range of motion. He exhibits no edema or tenderness.  Neurological: He is alert and oriented to person, place, and time.  Skin: Skin is warm and dry. No rash noted. No erythema.  Psychiatric: He has a normal mood and affect. His behavior is normal. Judgment and thought content normal.  Vitals reviewed.     BP (!) 161/89   Pulse (!) 121   Temp 97.7 F (36.5 C) (Oral)   Ht 5\' 10"  (1.778 m)   Wt 187 lb 3.2 oz (84.9 kg)   BMI 26.86 kg/m      Assessment & Plan:  1. Dental abscess Gargle with warm water after eating Tylenol  or motrin prn for pain Keep follow up appt with Dentist  - amoxicillin-clavulanate (AUGMENTIN) 875-125 MG tablet; Take 1 tablet by mouth 2 (two) times daily.  Dispense: 14 tablet; Refill: 0  2. Pain, dental2 - amoxicillin-clavulanate (AUGMENTIN) 875-125 MG tablet; Take 1 tablet by mouth 2 (two) times daily.  Dispense: 14 tablet; Refill: 0  3. Elevated blood pressure reading PT states his BP at home are WNL and this is elevated because of anxiety Keep follow up with PCP    Jannifer Rodneyhristy Johnthan Axtman, FNP

## 2017-08-28 NOTE — Patient Instructions (Signed)
Dental Abscess A dental abscess is a collection of pus in or around a tooth. What are the causes? This condition is caused by a bacterial infection around the root of the tooth that involves the inner part of the tooth (pulp). It may result from:  Severe tooth decay.  Trauma to the tooth that allows bacteria to enter into the pulp, such as a broken or chipped tooth.  Severe gum disease around a tooth.  What are the signs or symptoms? Symptoms of this condition include:  Severe pain in and around the infected tooth.  Swelling and redness around the infected tooth, in the mouth, or in the face.  Tenderness.  Pus drainage.  Bad breath.  Bitter taste in the mouth.  Difficulty swallowing.  Difficulty opening the mouth.  Nausea.  Vomiting.  Chills.  Swollen neck glands.  Fever.  How is this diagnosed? This condition is diagnosed with examination of the infected tooth. During the exam, your dentist may tap on the infected tooth. Your dentist will also ask about your medical and dental history and may order X-rays. How is this treated? This condition is treated by eliminating the infection. This may be done with:  Antibiotic medicine.  A root canal. This may be performed to save the tooth.  Pulling (extracting) the tooth. This may also involve draining the abscess. This is done if the tooth cannot be saved.  Follow these instructions at home:  Take medicines only as directed by your dentist.  If you were prescribed antibiotic medicine, finish all of it even if you start to feel better.  Rinse your mouth (gargle) often with salt water to relieve pain or swelling.  Do not drive or operate heavy machinery while taking pain medicine.  Do not apply heat to the outside of your mouth.  Keep all follow-up visits as directed by your dentist. This is important. Contact a health care provider if:  Your pain is worse and is not helped by medicine. Get help right away  if:  You have a fever or chills.  Your symptoms suddenly get worse.  You have a very bad headache.  You have problems breathing or swallowing.  You have trouble opening your mouth.  You have swelling in your neck or around your eye. This information is not intended to replace advice given to you by your health care provider. Make sure you discuss any questions you have with your health care provider. Document Released: 09/08/2005 Document Revised: 01/17/2016 Document Reviewed: 09/05/2014 Elsevier Interactive Patient Education  2017 [employer] Inc[employer]  

## 2018-07-10 ENCOUNTER — Ambulatory Visit (INDEPENDENT_AMBULATORY_CARE_PROVIDER_SITE_OTHER): Payer: Self-pay | Admitting: Family Medicine

## 2018-07-10 ENCOUNTER — Encounter: Payer: Self-pay | Admitting: Family Medicine

## 2018-07-10 VITALS — BP 139/77 | HR 102 | Temp 98.5°F | Ht 70.0 in | Wt 189.4 lb

## 2018-07-10 DIAGNOSIS — H6123 Impacted cerumen, bilateral: Secondary | ICD-10-CM

## 2018-07-10 DIAGNOSIS — H9203 Otalgia, bilateral: Secondary | ICD-10-CM

## 2018-07-10 NOTE — Patient Instructions (Signed)
Earwax removal: ° °Debrox drops are available without a prescription at your pharmacy. ° °Lay on your side with the ear up that you want to treat. Place for 5 drops of the Debrox in the ear canal and lay still for 15 minutes. After that time you considered up and allow the excess to run out of the year and catch it with a Kleenex. Repeat this with the other ear if needed. ° °Repeat this process daily for 1 week. By that time the ear should feel less clogged and her hearing should be better, if not, follow up in the office for recheck of the ear. ° °Thanks, °Aryianna Earwood °

## 2018-07-10 NOTE — Progress Notes (Signed)
Chief Complaint  Patient presents with  . Ear Pain    right ear pain 2-3 days, started after sinus infection cleared up, took OTC meds was not seen for sinus    HPI  Patient presents today for pain in right ear for 2 days. Not relieved when treated for sinusitis. Denies hearing loss. No fever.   PMH: Smoking status noted ROS: Per HPI  Objective: BP 139/77   Pulse (!) 102   Temp 98.5 F (36.9 C) (Oral)   Ht 5\' 10"  (1.778 m)   Wt 189 lb 6.4 oz (85.9 kg)   BMI 27.18 kg/m  Gen: NAD, alert, cooperative with exam HEENT: NCAT, EOMI, PERRL. Impacted cerumen bilaterally.  CV: RRR, good S1/S2, no murmur Resp: CTABL, no wheezes, non-labored Ext: No edema, warm Neuro: Alert and oriented, No gross deficits  Assessment and plan:  1. Otalgia of both ears     Impaction removed with lavage followed by ear curette bilaterally.   Large clump of cerumen removed, but some remained. To painful to continue. Pt. Will use debrox treatment at home. Follow up as needed.  Mechele Claude, MD

## 2020-09-15 ENCOUNTER — Other Ambulatory Visit: Payer: Self-pay

## 2020-09-15 ENCOUNTER — Emergency Department (HOSPITAL_COMMUNITY)
Admission: EM | Admit: 2020-09-15 | Discharge: 2020-09-15 | Disposition: A | Discharge status: Home / Self Care | Payer: Self-pay | Attending: Emergency Medicine | Admitting: Emergency Medicine

## 2020-09-15 ENCOUNTER — Encounter (HOSPITAL_COMMUNITY): Payer: Self-pay | Admitting: *Deleted

## 2020-09-15 DIAGNOSIS — K047 Periapical abscess without sinus: Secondary | ICD-10-CM | POA: Insufficient documentation

## 2020-09-15 MED ORDER — PENICILLIN V POTASSIUM 500 MG PO TABS: 500.0000 mg | ORAL_TABLET | Freq: Four times a day (QID) | ORAL | 0 refills | Status: AC

## 2020-09-15 MED ORDER — PENICILLIN V POTASSIUM 250 MG PO TABS
500.0000 mg | ORAL_TABLET | Freq: Once | ORAL | Status: AC
  Administered 2020-09-15: 23:00:00 500 mg via ORAL
  Filled 2020-09-15: qty 2

## 2020-09-15 NOTE — Discharge Instructions (Signed)
You were evaluated in the Emergency Department and after careful evaluation, we did not find any emergent condition requiring admission or further testing in the hospital.  Your exam/testing today is overall reassuring.  Symptoms seem to be due to an infection near the tooth.  Please take Tylenol or Motrin for pain and use the penicillin antibiotic as directed.  Follow-up with your dentist.  Please return to the Emergency Department if you experience any worsening of your condition.   Thank you for allowing [place] to be a part of your care.

## 2020-09-15 NOTE — ED Triage Notes (Signed)
Pt right dental pain for past 4 days. Swelling noted to right face as well.

## 2020-09-15 NOTE — ED Provider Notes (Signed)
AP-EMERGENCY DEPT [facility] Emergency Department Provider Note MRN:  [id number]  Arrival date & time: 09/15/20     Chief Complaint   Dental Pain   History of Present Illness   Edwin Baxter is a 29 y.o. year-old male with no pertinent past medical presenting to the ED with chief complaint of dental pain.  Patient explains that he is scheduled to have his wisdom teeth removed in January.  He has been experiencing lower right-sided tooth pain for the past 3 days.  Feeling some swelling near one of the molars.  Denies fever.  Pain is constant, moderate.  Review of Systems  A problem-focused ROS was performed. Positive for tooth pain.  Patient denies fever.  Patient's Health History   History reviewed. No pertinent past medical history.  History reviewed. No pertinent surgical history.  History reviewed. No pertinent family history.  Social History   Socioeconomic History  . Marital status: Single    Spouse name: Not on file  . Number of children: Not on file  . Years of education: Not on file  . Highest education level: Not on file  Occupational History  . Not on file  Tobacco Use  . Smoking status: Never Smoker  . Smokeless tobacco: Never Used  Substance and Sexual Activity  . Alcohol use: No  . Drug use: No  . Sexual activity: Not on file  Other Topics Concern  . Not on file  Social History Narrative  . Not on file   Social Determinants of Health   Financial Resource Strain: Not on file  Food Insecurity: Not on file  Transportation Needs: Not on file  Physical Activity: Not on file  Stress: Not on file  Social Connections: Not on file  Intimate Partner Violence: Not on file     Physical Exam   Vitals:   09/15/20 2112  BP: (!) 144/106  Pulse: (!) 117  Resp: 16  Temp: 99.3 F (37.4 C)  SpO2: 99%    CONSTITUTIONAL: Well-appearing, NAD NEURO:  Alert and oriented x 3, no focal deficits EYES:  eyes equal and reactive ENT/NECK:  no LAD, no  JVD CARDIO: Regular rate, well-perfused, normal S1 and S2 PULM:  CTAB no wheezing or rhonchi GI/GU:  normal bowel sounds, non-distended, non-tender MSK/SPINE:  No gross deformities, no edema SKIN:  no rash, atraumatic PSYCH:  Appropriate speech and behavior  *Additional and/or pertinent findings included in MDM below  Diagnostic and Interventional Summary    EKG Interpretation  Date/Time:    Ventricular Rate:    PR Interval:    QRS Duration:   QT Interval:    QTC Calculation:   R Axis:     Text Interpretation:        Labs Reviewed - No data to display  No orders to display    Medications  penicillin v potassium (VEETID) tablet 500 mg (has no administration in time range)     Procedures  /  Critical Care Procedures  ED Course and Medical Decision Making  I have reviewed the triage vital signs, the nursing notes, and pertinent available records from the EMR.  Listed above are laboratory and imaging tests that I personally ordered, reviewed, and interpreted and then considered in my medical decision making (see below for details).  Patient is well-appearing, not tachycardic on my assessment.  No signs of significant contiguous infection to the mouth or teeth.  Does have some fullness near right mandibular molar but there is no fluctuance or obvious  abscess.  Has relatively close follow-up with dentistry, will provide antibiotics.  Strict return precautions       Elmer Sow. Pilar Plate, MD [facility] Emergency Medicine [facility] [email or web address]  Final Clinical Impressions(s) / ED Diagnoses     ICD-10-CM   1. Tooth infection  K04.7     ED Discharge Orders         Ordered    penicillin v potassium (VEETID) 500 MG tablet  4 times daily        09/15/20 2256           Discharge Instructions Discussed with and Provided to Patient:     Discharge Instructions     You were evaluated in the Emergency Department and after careful evaluation, we  did not find any emergent condition requiring admission or further testing in the hospital.  Your exam/testing today is overall reassuring.  Symptoms seem to be due to an infection near the tooth.  Please take Tylenol or Motrin for pain and use the penicillin antibiotic as directed.  Follow-up with your dentist.  Please return to the Emergency Department if you experience any worsening of your condition.   Thank you for allowing [place] to be a part of your care.      Sabas Sous, MD 09/15/20 2259

## 2021-11-27 ENCOUNTER — Ambulatory Visit: Payer: Self-pay | Admitting: Family Medicine

## 2021-11-30 DIAGNOSIS — Z6824 Body mass index (BMI) 24.0-24.9, adult: Secondary | ICD-10-CM | POA: Diagnosis not present

## 2021-11-30 DIAGNOSIS — L089 Local infection of the skin and subcutaneous tissue, unspecified: Secondary | ICD-10-CM | POA: Diagnosis not present

## 2022-10-24 DIAGNOSIS — M25562 Pain in left knee: Secondary | ICD-10-CM | POA: Diagnosis not present

## 2023-02-03 ENCOUNTER — Encounter: Payer: Self-pay | Admitting: Family Medicine

## 2023-02-03 ENCOUNTER — Ambulatory Visit (INDEPENDENT_AMBULATORY_CARE_PROVIDER_SITE_OTHER): Payer: BC Managed Care – PPO | Admitting: Family Medicine

## 2023-02-03 VITALS — BP 121/82 | HR 109 | Temp 98.3°F | Ht 71.0 in | Wt 182.0 lb

## 2023-02-03 DIAGNOSIS — M25562 Pain in left knee: Secondary | ICD-10-CM

## 2023-02-03 DIAGNOSIS — R000 Tachycardia, unspecified: Secondary | ICD-10-CM

## 2023-02-03 MED ORDER — PREDNISONE 20 MG PO TABS: 40.0000 mg | ORAL_TABLET | Freq: Every day | ORAL | 0 refills | Status: AC

## 2023-02-03 NOTE — Progress Notes (Signed)
New Patient Office Visit  Subjective   Patient ID: Edwin Baxter, male    DOB: 05-24-1991  Age: 32 y.o. MRN: [id number]  CC:  Chief Complaint  Patient presents with   New Patient (Initial Visit)    Establish care   HPI Edwin Baxter presents to establish care. Works at [employer]. Reports that he is generally active and tries to eat healthy.   Main concern today is knee pain.  States that he was doing something at work and it "all of a sudden" starting hurting 5 months ago and was not able to walk on it for a few days. He was pushing a palate at work 2 months ago and feels that he "retweeked" it. He was seen at [facility] then and received Xray, prescribed ibuprofen and RICE instructions. Has pain with weight bearing. Has tried wearing knee brace and stretches. Has not taken OTC medications. Has used ice a few times. States that it helps some, but not much. Denies swelling redness, fever, numbness, tingling.   Ate breakfast at 9 am.   Tachycardia  States that it is high during the doctor's office. He states it is from his anxiety. He states it is higher at the dentist also.   No outpatient encounter medications on file as of 02/03/2023.   No facility-administered encounter medications on file as of 02/03/2023.   History reviewed. No pertinent past medical history.  History reviewed. No pertinent surgical history.  History reviewed. No pertinent family history.  Social History   Socioeconomic History   Marital status: Single    Spouse name: Not on file   Number of children: Not on file   Years of education: Not on file   Highest education level: Not on file  Occupational History   Not on file  Tobacco Use   Smoking status: Never   Smokeless tobacco: Never  Substance and Sexual Activity   Alcohol use: No   Drug use: No   Sexual activity: Not on file  Other Topics Concern   Not on file  Social History Narrative   Not on file   Social Determinants of Health   Financial  Resource Strain: Not on file  Food Insecurity: Not on file  Transportation Needs: Not on file  Physical Activity: Not on file  Stress: Not on file  Social Connections: Not on file  Intimate Partner Violence: Not on file   ROS As per HPI   Objective   BP 121/82   Pulse (!) 109   Temp 98.3 F (36.8 C)   Ht 5\' 11"  (1.803 m)   Wt 182 lb (82.6 kg)   SpO2 99%   BMI 25.38 kg/m     02/03/2023   11:01 AM 09/15/2020   11:17 PM 09/15/2020    9:12 PM  Vitals with BMI  Height 5\' 11"   5\' 11"   Weight 182 lbs  185 lbs  BMI 25.4  25.81  Systolic 121 144 409  Diastolic 82 92 106  Pulse 109 811 117   Physical Exam Constitutional:      General: He is not in acute distress.    Appearance: Normal appearance. He is not ill-appearing, toxic-appearing or diaphoretic.  Cardiovascular:     Rate and Rhythm: Regular rhythm. Tachycardia present.     Pulses: Normal pulses.          Radial pulses are 2+ on the right side and 2+ on the left side.     Heart sounds: Normal heart  sounds. No murmur heard.    No gallop.  Pulmonary:     Effort: Pulmonary effort is normal. No respiratory distress.     Breath sounds: Normal breath sounds. No stridor. No wheezing, rhonchi or rales.  Musculoskeletal:     Right lower leg: No edema.     Left lower leg: No edema.  Skin:    General: Skin is warm.     Capillary Refill: Capillary refill takes less than 2 seconds.  Neurological:     General: No focal deficit present.     Mental Status: He is alert and oriented to person, place, and time. Mental status is at baseline.     Motor: No weakness.  Psychiatric:        Mood and Affect: Mood normal.        Behavior: Behavior normal.        Thought Content: Thought content normal.        Judgment: Judgment normal.     Assessment & Plan:  1. Acute pain of left knee Discussed with patient the side effects and risks of medication as below. Will start with conservative management. Instructed patient to return in 6  weeks if not improved for further eval. Reviewed XR from Urgent care 10/24/22. No acute abnormalities.  - predniSONE (DELTASONE) 20 MG tablet; Take 2 tablets (40 mg total) by mouth daily with breakfast for 5 days.  Dispense: 10 tablet; Refill: 0 - Ambulatory referral to Physical Therapy XR Knee 3 Views Left 10/24/22 FINDINGS:  No evidence of fracture, dislocation, or joint effusion. Normal  alignment. Normal joint spaces. No evidence of arthropathy or other  focal bone abnormality. Soft tissues are unremarkable.   2. Tachycardia Patient refused labs and EKG. Discussed with patient the risks. He insisted that it was due to anxiety related to visit. Will assess at future visit. Educated patient on side effect of prednisone. Discussed with patient to avoid stimulants.   The above assessment and management plan was discussed with the patient. The patient verbalized understanding of and has agreed to the management plan using shared-decision making. Patient is aware to call the clinic if they develop any new symptoms or if symptoms fail to improve or worsen. Patient is aware when to return to the clinic for a follow-up visit. Patient educated on when it is appropriate to go to the emergency department.   Return in about 3 months (around 05/06/2023) for CPE with fasting labs .   Neale Burly, DNP-FNP [facility] [address] [place], [place] [address] [phone]

## 2023-02-03 NOTE — Patient Instructions (Addendum)
RICE  REST, ICE, Compression, Elevation  Return in 6 weeks if not better for follow up.

## 2023-03-02 ENCOUNTER — Ambulatory Visit: Payer: BC Managed Care – PPO

## 2023-05-07 ENCOUNTER — Ambulatory Visit (INDEPENDENT_AMBULATORY_CARE_PROVIDER_SITE_OTHER): Payer: BC Managed Care – PPO | Admitting: Family Medicine

## 2023-05-07 ENCOUNTER — Encounter: Payer: Self-pay | Admitting: Family Medicine

## 2023-05-07 VITALS — BP 145/82 | HR 100 | Ht 71.0 in | Wt 183.0 lb

## 2023-05-07 DIAGNOSIS — Z0001 Encounter for general adult medical examination with abnormal findings: Secondary | ICD-10-CM

## 2023-05-07 DIAGNOSIS — M25562 Pain in left knee: Secondary | ICD-10-CM | POA: Diagnosis not present

## 2023-05-07 DIAGNOSIS — Z0000 Encounter for general adult medical examination without abnormal findings: Secondary | ICD-10-CM

## 2023-05-07 DIAGNOSIS — R739 Hyperglycemia, unspecified: Secondary | ICD-10-CM | POA: Diagnosis not present

## 2023-05-07 DIAGNOSIS — R000 Tachycardia, unspecified: Secondary | ICD-10-CM | POA: Diagnosis not present

## 2023-05-07 NOTE — Progress Notes (Signed)
Edwin Baxter is a 32 y.o. male presents to office today for annual physical exam examination.    Concerns today include: 1. Feels that his knee is doing better, but does not feel that it is fully healed  States that it was $280 per session at PT, so there was a financial burden and he did not continue going. Is still wearing knee brace. States that the pain is on the side of his medial knee.  At work he is on his knee repetitively. Reports that he has not been taking OTC medications as he does not like to take many pills   Occupation: [employer]  Marital status: none  Diet: states that it is not the greatest, trying to eat more salads  Exercise: daily, when knee bothers him, mainly upper body. Some walking on treadmill  Substance use: Denies  Last eye exam: states it was at 32 years old, denies any changes to vision. States that he is using the same glasses as he was when he was 15.  Last dental exam: Dr. Loleta Chance, UTD  Last colonoscopy: not due  PSA: denies significant family hx of Prostate CA  Refills needed today: none Other specialists seen: none  Dermatology exam: none - not interested  Fasting today:  no  Immunizations needed: Flu Vaccine: no  Tdap Vaccine: due - declines  - every 34yrs - (<3 lifetime doses or unknown): all wounds -- look up need for Tetanus IG - (>=3 lifetime doses): clean/minor wound if >72yrs from previous; all other wounds if >26yrs from previous Zoster Vaccine: n/a (those >50yo, once) Pneumonia Vaccine: n/a  (those w/ risk factors) - (<30yr) Both: Immunocompromised, cochlear implant, CSF leak, asplenic, sickle cell, Chronic Renal Failure - (<92yr) PPSV-23 only: Heart dz, lung disease, DM, tobacco abuse, alcoholism, cirrhosis/liver disease. - (>48yr): PPSV13 then PPSV23 in 6-12mths;  - (>57yr): repeat PPSV23 once if pt received prior to 32yo and 66yrs have passed   No past medical history on file. Social History   Socioeconomic History   Marital  status: Single    Spouse name: Not on file   Number of children: Not on file   Years of education: Not on file   Highest education level: Not on file  Occupational History   Not on file  Tobacco Use   Smoking status: Never   Smokeless tobacco: Never  Substance and Sexual Activity   Alcohol use: No   Drug use: No   Sexual activity: Not on file  Other Topics Concern   Not on file  Social History Narrative   Not on file   Social Determinants of Health   Financial Resource Strain: Not on file  Food Insecurity: Not on file  Transportation Needs: Not on file  Physical Activity: Not on file  Stress: Not on file  Social Connections: Not on file  Intimate Partner Violence: Not on file   No past surgical history on file. No family history on file.  Current Outpatient Medications:    famotidine (PEPCID) 10 MG tablet, Take 10 mg by mouth 2 (two) times daily., Disp: , Rfl:    Melatonin 5 MG CHEW, Chew 5 mg by mouth at bedtime., Disp: , Rfl:   Allergies  Allergen Reactions   Ciprofloxacin Other (See Comments)    aches     ROS: Review of Systems Review of Systems  All other systems reviewed and are negative.  As per HPI   Physical exam    05/07/2023   10:12  AM 05/07/2023   10:04 AM 02/03/2023   11:01 AM  Vitals with BMI  Height  5\' 11"  5\' 11"   Weight  183 lbs 182 lbs  BMI  25.53 25.4  Systolic 138 151 409  Diastolic 80 83 82  Pulse 230 240 109    Physical Exam Constitutional:      General: He is awake. He is not in acute distress.    Appearance: Normal appearance. He is well-developed, well-groomed and normal weight. He is not ill-appearing, toxic-appearing or diaphoretic.     Interventions: He is not intubated. HENT:     Right Ear: Tympanic membrane and external ear normal. There is impacted cerumen.     Left Ear: Tympanic membrane and external ear normal. There is impacted cerumen.     Nose: Nose normal.     Mouth/Throat:     Mouth: Mucous membranes are moist.      Pharynx: Oropharynx is clear. No oropharyngeal exudate or posterior oropharyngeal erythema.  Eyes:     General: Allergic shiner present.     Conjunctiva/sclera: Conjunctivae normal.     Pupils: Pupils are equal, round, and reactive to light. Pupils are equal.  Neck:     Thyroid: No thyroid mass.     Trachea: Trachea and phonation normal.  Cardiovascular:     Rate and Rhythm: Normal rate and regular rhythm.     Pulses: Normal pulses.          Radial pulses are 2+ on the right side and 2+ on the left side.     Heart sounds: Normal heart sounds.  Pulmonary:     Effort: Pulmonary effort is normal. No tachypnea, bradypnea, accessory muscle usage, prolonged expiration, respiratory distress or retractions. He is not intubated.     Breath sounds: Normal breath sounds. No stridor, decreased air movement or transmitted upper airway sounds. No decreased breath sounds, wheezing, rhonchi or rales.  Abdominal:     General: Abdomen is flat. Bowel sounds are normal.     Palpations: Abdomen is soft.     Tenderness: There is no abdominal tenderness. There is no right CVA tenderness or left CVA tenderness.     Hernia: No hernia is present.  Musculoskeletal:        General: Normal range of motion.     Cervical back: Normal range of motion.     Right lower leg: No edema.     Left lower leg: No edema.     Comments: Soft brace on left knee   Lymphadenopathy:     Head:     Right side of head: No submental, submandibular, tonsillar, preauricular or posterior auricular adenopathy.     Left side of head: No submental, submandibular, tonsillar, preauricular or posterior auricular adenopathy.     Cervical: No cervical adenopathy.     Right cervical: No superficial or deep cervical adenopathy.    Left cervical: No superficial or deep cervical adenopathy.  Skin:    General: Skin is warm.     Capillary Refill: Capillary refill takes less than 2 seconds.  Neurological:     General: No focal deficit  present.     Mental Status: He is alert, oriented to person, place, and time and easily aroused. Mental status is at baseline.     Cranial Nerves: No cranial nerve deficit.     Motor: Motor function is intact. No weakness.     Coordination: Coordination is intact.     Gait: Gait is intact. Gait normal.  Psychiatric:        Attention and Perception: Attention and perception normal.        Mood and Affect: Mood normal.        Speech: Speech normal.        Behavior: Behavior normal. Behavior is cooperative.        Thought Content: Thought content normal.        Cognition and Memory: Cognition normal.        Judgment: Judgment normal.     Assessment/ Plan: Muzamil Ramcharan here for annual physical exam.  1. Routine general medical examination at a health care facility Discussed with patient to continue healthy lifestyle choices, including diet (rich in fruits, vegetables, and lean proteins, and low in salt and simple carbohydrates) and exercise (at least 30 minutes of moderate physical activity daily). Limit beverages high is sugar. Recommended at least 80-100 oz of water daily.  Labs as below. Will communicate results to patient once available. Will await results to determine next steps.  Not fasting  Declined TDAP immunization  - CBC with Differential/Platelet - CMP14+EGFR - Lipid panel - TSH - VITAMIN D 25 Hydroxy (Vit-D Deficiency, Fractures)  2. Recurrent pain of left knee Due to continued pain after conservative treatment, will referral to ortho. Discussed OTC treatments for patient to continue.  - Ambulatory referral to Orthopedic Surgery  3. Tachycardia Patient refused EKG.   Patient to follow up in 1 year for annual exam or sooner if needed.  The above assessment and management plan was discussed with the patient. The patient verbalized understanding of and has agreed to the management plan. Patient is aware to call the clinic if symptoms persist or worsen. Patient is aware  when to return to the clinic for a follow-up visit. Patient educated on when it is appropriate to go to the emergency department.   Neale Burly, DNP-FNP [facility] [address] [place], [place] [address] [phone]

## 2023-05-07 NOTE — Patient Instructions (Signed)
Health Maintenance, Male Adopting a healthy lifestyle and getting preventive care are important in promoting health and wellness. Ask your health care provider about: The right schedule for you to have regular tests and exams. Things you can do on your own to prevent diseases and keep yourself healthy. What should I know about diet, weight, and exercise? Eat a healthy diet  Eat a diet that includes plenty of vegetables, fruits, low-fat dairy products, and lean protein. Do not eat a lot of foods that are high in solid fats, added sugars, or sodium. Maintain a healthy weight Body mass index (BMI) is a measurement that can be used to identify possible weight problems. It estimates body fat based on height and weight. Your health care provider can help determine your BMI and help you achieve or maintain a healthy weight. Get regular exercise Get regular exercise. This is one of the most important things you can do for your health. Most adults should: Exercise for at least 150 minutes each week. The exercise should increase your heart rate and make you sweat (moderate-intensity exercise). Do strengthening exercises at least twice a week. This is in addition to the moderate-intensity exercise. Spend less time sitting. Even light physical activity can be beneficial. Watch cholesterol and blood lipids Have your blood tested for lipids and cholesterol at 33 years of age, then have this test every 5 years. You may need to have your cholesterol levels checked more often if: Your lipid or cholesterol levels are high. You are older than 33 years of age. You are at high risk for heart disease. What should I know about cancer screening? Many types of cancers can be detected early and may often be prevented. Depending on your health history and family history, you may need to have cancer screening at various ages. This may include screening for: Colorectal cancer. Prostate cancer. Skin cancer. Lung  cancer. What should I know about heart disease, diabetes, and high blood pressure? Blood pressure and heart disease High blood pressure causes heart disease and increases the risk of stroke. This is more likely to develop in people who have high blood pressure readings or are overweight. Talk with your health care provider about your target blood pressure readings. Have your blood pressure checked: Every 3-5 years if you are 18-39 years of age. Every year if you are 40 years old or older. If you are between the ages of 65 and 75 and are a current or former smoker, ask your health care provider if you should have a one-time screening for abdominal aortic aneurysm (AAA). Diabetes Have regular diabetes screenings. This checks your fasting blood sugar level. Have the screening done: Once every three years after age 45 if you are at a normal weight and have a low risk for diabetes. More often and at a younger age if you are overweight or have a high risk for diabetes. What should I know about preventing infection? Hepatitis B If you have a higher risk for hepatitis B, you should be screened for this virus. Talk with your health care provider to find out if you are at risk for hepatitis B infection. Hepatitis C Blood testing is recommended for: Everyone born from 1945 through 1965. Anyone with known risk factors for hepatitis C. Sexually transmitted infections (STIs) You should be screened each year for STIs, including gonorrhea and chlamydia, if: You are sexually active and are younger than 33 years of age. You are older than 33 years of age and your   health care provider tells you that you are at risk for this type of infection. Your sexual activity has changed since you were last screened, and you are at increased risk for chlamydia or gonorrhea. Ask your health care provider if you are at risk. Ask your health care provider about whether you are at high risk for HIV. Your health care provider  may recommend a prescription medicine to help prevent HIV infection. If you choose to take medicine to prevent HIV, you should first get tested for HIV. You should then be tested every 3 months for as long as you are taking the medicine. Follow these instructions at home: Alcohol use Do not drink alcohol if your health care provider tells you not to drink. If you drink alcohol: Limit how much you have to 0-2 drinks a day. Know how much alcohol is in your drink. In the [place] one drink equals one 12 oz bottle of beer (355 mL), one 5 oz glass of wine (148 mL), or one 1 oz glass of hard liquor (44 mL). Lifestyle Do not use any products that contain nicotine or tobacco. These products include cigarettes, chewing tobacco, and vaping devices, such as e-cigarettes. If you need help quitting, ask your health care provider. Do not use street drugs. Do not share needles. Ask your health care provider for help if you need support or information about quitting drugs. General instructions Schedule regular health, dental, and eye exams. Stay current with your vaccines. Tell your health care provider if: You often feel depressed. You have ever been abused or do not feel safe at home. Summary Adopting a healthy lifestyle and getting preventive care are important in promoting health and wellness. Follow your health care provider's instructions about healthy diet, exercising, and getting tested or screened for diseases. Follow your health care provider's instructions on monitoring your cholesterol and blood pressure. This information is not intended to replace advice given to you by your health care provider. Make sure you discuss any questions you have with your health care provider. Document Revised: 01/28/2021 Document Reviewed: 01/28/2021 Elsevier Patient Education  2024 [employer]  

## 2023-05-08 ENCOUNTER — Other Ambulatory Visit: Payer: Self-pay

## 2023-05-08 DIAGNOSIS — R7309 Other abnormal glucose: Secondary | ICD-10-CM

## 2023-05-08 LAB — LIPID PANEL
Chol/HDL Ratio: 3.7 ratio (ref 0.0–5.0)
Cholesterol, Total: 187 mg/dL (ref 100–199)
HDL: 51 mg/dL (ref >39)
LDL Chol Calc (NIH): 112 mg/dL — ABNORMAL HIGH (ref 0–99)
Triglycerides: 135 mg/dL (ref 0–149)
VLDL Cholesterol Cal: 24 mg/dL (ref 5–40)

## 2023-05-08 LAB — CMP14+EGFR
ALT: 17 IU/L (ref 0–44)
AST: 20 IU/L (ref 0–40)
Albumin: 4.8 g/dL (ref 4.1–5.1)
Alkaline Phosphatase: 79 IU/L (ref 44–121)
BUN/Creatinine Ratio: 18 (ref 9–20)
BUN: 20 mg/dL (ref 6–20)
Bilirubin Total: 0.5 mg/dL (ref 0.0–1.2)
CO2: 23 mmol/L (ref 20–29)
Calcium: 10.1 mg/dL (ref 8.7–10.2)
Chloride: 103 mmol/L (ref 96–106)
Creatinine, Ser: 1.11 mg/dL (ref 0.76–1.27)
Globulin, Total: 2.3 g/dL (ref 1.5–4.5)
Glucose: 105 mg/dL — ABNORMAL HIGH (ref 70–99)
Potassium: 4.1 mmol/L (ref 3.5–5.2)
Sodium: 141 mmol/L (ref 134–144)
Total Protein: 7.1 g/dL (ref 6.0–8.5)
eGFR: 90 mL/min/{1.73_m2} (ref >59)

## 2023-05-08 LAB — CBC WITH DIFFERENTIAL/PLATELET
Basophils Absolute: 0 10*3/uL (ref 0.0–0.2)
Basos: 1 %
EOS (ABSOLUTE): 0 10*3/uL (ref 0.0–0.4)
Eos: 0 %
Hematocrit: 48.8 % (ref 37.5–51.0)
Hemoglobin: 16.4 g/dL (ref 13.0–17.7)
Immature Grans (Abs): 0 10*3/uL (ref 0.0–0.1)
Immature Granulocytes: 0 %
Lymphocytes Absolute: 1.1 10*3/uL (ref 0.7–3.1)
Lymphs: 23 %
MCH: 29.3 pg (ref 26.6–33.0)
MCHC: 33.6 g/dL (ref 31.5–35.7)
MCV: 87 fL (ref 79–97)
Monocytes Absolute: 0.3 10*3/uL (ref 0.1–0.9)
Monocytes: 7 %
Neutrophils Absolute: 3.3 10*3/uL (ref 1.4–7.0)
Neutrophils: 69 %
Platelets: 269 10*3/uL (ref 150–450)
RBC: 5.59 x10E6/uL (ref 4.14–5.80)
RDW: 12.1 % (ref 11.6–15.4)
WBC: 4.8 10*3/uL (ref 3.4–10.8)

## 2023-05-08 LAB — TSH: TSH: 1.29 u[IU]/mL (ref 0.450–4.500)

## 2023-05-08 LAB — VITAMIN D 25 HYDROXY (VIT D DEFICIENCY, FRACTURES): Vit D, 25-Hydroxy: 31 ng/mL (ref 30.0–100.0)

## 2023-05-11 LAB — HGB A1C W/O EAG: Hgb A1c MFr Bld: 5.5 % (ref 4.8–5.6)

## 2023-05-11 LAB — SPECIMEN STATUS REPORT

## 2023-05-14 ENCOUNTER — Telehealth: Payer: Self-pay | Admitting: Family Medicine

## 2023-05-15 NOTE — Telephone Encounter (Signed)
Patient was incorrectly sent to [facility] which is no longer being scheduled. I have redirected Patient's Referral to [facility] in [place]. I also R/C to Patient to make him aware of this information and to provide him with [facility]'s contact information if he would need to check on Referral further.

## 2023-05-19 NOTE — Progress Notes (Signed)
Slightly elevated glucose. A1C within normal range. Slightly elevated LDL. Lifestyle modifications encouraged - increase intake of fresh fruits and vegetables, increase intake of lean proteins. Bake, broil, or grill foods. Avoid fried, greasy, and fatty foods. Avoid fast foods. Increase intake of fiber-rich whole grains. Exercise encouraged - at least 150 minutes per week and advance as tolerated. Can also try red yeast rice and we will recheck in 3 months.  All other labs normal.

## 2023-05-20 NOTE — Telephone Encounter (Signed)
No :) Patient is scheduled with [facility] on 05/26/2023.

## 2023-05-26 ENCOUNTER — Encounter: Payer: Self-pay | Admitting: Orthopedic Surgery

## 2023-05-26 ENCOUNTER — Ambulatory Visit (INDEPENDENT_AMBULATORY_CARE_PROVIDER_SITE_OTHER): Payer: BC Managed Care – PPO | Admitting: Orthopedic Surgery

## 2023-05-26 ENCOUNTER — Other Ambulatory Visit (INDEPENDENT_AMBULATORY_CARE_PROVIDER_SITE_OTHER): Payer: BC Managed Care – PPO

## 2023-05-26 VITALS — BP 161/102 | HR 114 | Ht 71.0 in | Wt 181.0 lb

## 2023-05-26 DIAGNOSIS — G8929 Other chronic pain: Secondary | ICD-10-CM

## 2023-05-26 DIAGNOSIS — M25362 Other instability, left knee: Secondary | ICD-10-CM

## 2023-05-26 DIAGNOSIS — M25562 Pain in left knee: Secondary | ICD-10-CM | POA: Diagnosis not present

## 2023-05-26 NOTE — Addendum Note (Signed)
Addended by: Baird Kay on: 05/26/2023 09:09 AM   Modules accepted: Orders

## 2023-05-26 NOTE — Progress Notes (Signed)
New Patient Visit  Assessment: Edwin Baxter is a 32 y.o. male with the following: 1. Chronic pain of left knee 2. Knee buckling, left  Plan: Londen Converse has had pain in the left knee for several months.  He reports regular locking sensations.  He has popping in the knee, as well as buckling of the left knee.  Medications have not.  He has been unable to work with physical therapy.  At this point, given his mechanism of injury, as well as the sensations that he is feeling the left knee, and his desire to remain active both recreationally, as well as work, I am recommending an MRI of the left knee.  Once the results are available, we will meet in clinic to discuss the findings.  Follow-up: Return for After MRI.  Subjective:  Chief Complaint  Patient presents with   Knee Pain    L knee pain for 6 mos getting better but still has some lingering pain.     History of Present Illness: [name] is a 32 y.o. male who presents for evaluation of left knee pain.  Approximately 6 months ago, he was at work.  He was moving a pallet, when he felt pain in the medial aspect of the left knee.  He cannot remember if he had any swelling.  Since then, he notes that the knee will lock up on him.  He has some issues with his range of motion.  He localizes the pain to the medial aspect of the left knee.  He has tried over-the-counter medications, limited improvement in his symptoms.  He does feel as though the left knee buckles.  He also has popping sensations in the left knee.  No prior injuries to the left knee.  He does not use a brace.   Review of Systems: No fevers or chills No numbness or tingling No chest pain No shortness of breath No bowel or bladder dysfunction No GI distress No headaches   Medical History:  History reviewed. No pertinent past medical history.  History reviewed. No pertinent surgical history.  History reviewed. No pertinent family history. Social History    Tobacco Use   Smoking status: Never   Smokeless tobacco: Never  Substance Use Topics   Alcohol use: No   Drug use: No    Allergies  Allergen Reactions   Ciprofloxacin Other (See Comments)    aches    Current Meds  Medication Sig   famotidine (PEPCID) 10 MG tablet Take 10 mg by mouth 2 (two) times daily.   Melatonin 5 MG CHEW Chew 5 mg by mouth at bedtime.    Objective: BP (!) 161/102   Pulse (!) 114   Ht 5\' 11"  (1.803 m)   Wt 181 lb (82.1 kg)   BMI 25.24 kg/m   Physical Exam:  General: Alert and oriented. and No acute distress. Gait: Normal gait.  Evaluation of the left knee demonstrates no swelling.  He is able to achieve full extension.  He has flexion to 130 degrees, with some pain in the hyperflexion position.  Positive McMurray's medially.  Tenderness to palpation along the medial joint line.  Negative Lachman.  No increased laxity varus valgus stress.  Small bony protuberance just medial to the patella tendon.  IMAGING: I personally ordered and reviewed the following images   X-rays of the left knee were obtained in clinic today.  No acute injuries are noted.  Neutral overall alignment.  Maintained joint space in both the medial  and lateral compartments.  No osteophytes.  Bony lesions.  Impression: Negative left knee x-ray.   New Medications:  No orders of the defined types were placed in this encounter.     Oliver Barre, MD  05/26/2023 9:00 AM

## 2023-05-30 ENCOUNTER — Ambulatory Visit (HOSPITAL_COMMUNITY): Payer: BC Managed Care – PPO
